# Patient Record
Sex: Female | Born: 1945 | Hispanic: Yes | Marital: Married | Smoking: Never smoker
Health system: Southern US, Community
[De-identification: ages and names within clinical notes are randomized; demographics above are authoritative.]

## PROBLEM LIST (undated history)

## (undated) DIAGNOSIS — M1990 Unspecified osteoarthritis, unspecified site: Secondary | ICD-10-CM

## (undated) DIAGNOSIS — E785 Hyperlipidemia, unspecified: Secondary | ICD-10-CM

## (undated) DIAGNOSIS — M810 Age-related osteoporosis without current pathological fracture: Secondary | ICD-10-CM

## (undated) HISTORY — PX: COLONOSCOPY: SHX174

## (undated) HISTORY — PX: DILATION AND CURETTAGE OF UTERUS: SHX78

## (undated) HISTORY — DX: Hyperlipidemia, unspecified: E78.5

## (undated) HISTORY — DX: Age-related osteoporosis without current pathological fracture: M81.0

## (undated) HISTORY — PX: OOPHORECTOMY: SHX86

## (undated) HISTORY — DX: Unspecified osteoarthritis, unspecified site: M19.90

---

## 2001-06-04 HISTORY — PX: CYSTECTOMY: SUR359

## 2012-01-28 ENCOUNTER — Encounter: Payer: Self-pay | Admitting: Obstetrics & Gynecology

## 2012-02-18 ENCOUNTER — Encounter: Payer: Self-pay | Admitting: Internal Medicine

## 2012-03-14 ENCOUNTER — Ambulatory Visit: Payer: Self-pay | Admitting: Internal Medicine

## 2012-03-20 ENCOUNTER — Ambulatory Visit (INDEPENDENT_AMBULATORY_CARE_PROVIDER_SITE_OTHER): Payer: Self-pay | Admitting: Obstetrics & Gynecology

## 2012-03-20 ENCOUNTER — Encounter: Payer: Self-pay | Admitting: Obstetrics & Gynecology

## 2012-03-20 VITALS — BP 139/90 | HR 73 | Temp 98.1°F | Ht <= 58 in | Wt 164.1 lb

## 2012-03-20 DIAGNOSIS — R32 Unspecified urinary incontinence: Secondary | ICD-10-CM

## 2012-03-20 NOTE — Progress Notes (Signed)
Subjective:     Patient ID: Andrea Hendricks, female   DOB: 08-24-1945, 66 y.o.   MRN: [id number]  HPI G12 P10  Pt was seen for a PAP screen and was told that she had a 'prolapse'.  Pt denies sx of prolapse.   Deies 'bulge'  Pt does report leakage of urine if she drinks a lot of water.  This is bothersome.   No visual acuity check for >10years.  Review of Systems     Objective:   Physical ExamBP 139/90  Pulse 73  Temp 98.1 F (36.7 C) (Oral)  Ht 4' 9.5" (1.461 m)  Wt 164 lb 1.6 oz (74.435 kg)  BMI 34.90 kg/m2  HEENT: Pt squints when speaking.  Could not read numbers on BP cuff without corretion  GU: EGBUS: no lesions Vagina: no blood in vault Cervix: no lesion; no mucopurulent d/c Uterus: small, mobile; no prolapse noted Adnexa: no masses; non tender        Assessment:    Incontinence- suspect stress- pt has previously been told to do "Kegel' exercises but, has never been told HOW to do them or WHAT they were- reviewed Kegel exercises.  Rec Kegel's 10 4x/day       Plan:     F/u 3 months Kegel's F/u with primary care physician Get vision checked  Andrea Hendricks, M.D., [facility]

## 2012-03-20 NOTE — Patient Instructions (Signed)
Incontinencia urinaria (Urinary Incontinence) El mdico desea que usted tenga esta informacin sobre la incontinencia urinaria. La incontinencia urinaria es la imposibilidad de retener la orina hasta que decida ir al bao. CAUSAS El agrandamiento de la prstata es una causa frecuente de incontinencia urinaria. Pero hay muchas causas diferentes para perder el control urinario. Ellas son:  Medicamentos  Infecciones  Problemas prostticos  Una ciruga  Enfermedades neurolgicas  Factores emocionales DIAGNSTICO La evaluacin de la causa de incontinencia es importante para la eleccin del mejor tratamiento. Podrn indicarle:  Sherlyn Lees.  Radiografas de vejiga y riones.  Una cistoscopa Consiste en un examen de la vejiga utilizando un dispositivo similar a un telescopio pequeo. TRATAMIENTO Para los pacientes que sufren incontinencia, la higiene diaria y la utilizacin de apsitos o paales descartables para adultos evitar olores desagradables y lesiones en la piel debido a la humedad. El cambio de medicamentos puede ayudar a Geneticist, molecular. El mdico podr prescribir algunos medicamentos para volver a Museum/gallery conservator. Evite la cafena. Puede sobreestimular la vejiga. Use el bao con regularidad. Trate de ir cada 2  3 horas, aunque no sienta la necesidad. Tmese el tiempo para vaciar la vejiga completamente. Despus de orinar, espere un minuto. Luego trate de orinar nuevamente. Tambin podra ser necesario utilizar dispositivos externos para Landscape architect la orina tales como un catter Production assistant, radio (Armed forces operational officer). Algunos problemas prostticos requieren [place] para su correccin. Comunquese con su mdico para obtener ms informacin. Document Released: 05/21/2005 Document Revised: 08/13/2011 [facility] Patient Information 2013 [place], [place].

## 2012-03-21 ENCOUNTER — Encounter: Payer: Self-pay | Admitting: Internal Medicine

## 2012-03-21 ENCOUNTER — Other Ambulatory Visit (INDEPENDENT_AMBULATORY_CARE_PROVIDER_SITE_OTHER): Payer: Self-pay | Admitting: General Practice

## 2012-03-21 ENCOUNTER — Ambulatory Visit (INDEPENDENT_AMBULATORY_CARE_PROVIDER_SITE_OTHER): Payer: Self-pay | Admitting: Internal Medicine

## 2012-03-21 VITALS — BP 134/78 | HR 88 | Ht 59.0 in | Wt 167.0 lb

## 2012-03-21 VITALS — BP 131/86 | HR 64 | Temp 97.1°F | Ht <= 58 in | Wt 163.2 lb

## 2012-03-21 DIAGNOSIS — IMO0001 Reserved for inherently not codable concepts without codable children: Secondary | ICD-10-CM

## 2012-03-21 DIAGNOSIS — M810 Age-related osteoporosis without current pathological fracture: Secondary | ICD-10-CM

## 2012-03-21 DIAGNOSIS — R1032 Left lower quadrant pain: Secondary | ICD-10-CM

## 2012-03-21 DIAGNOSIS — Z23 Encounter for immunization: Secondary | ICD-10-CM

## 2012-03-21 DIAGNOSIS — M7918 Myalgia, other site: Secondary | ICD-10-CM

## 2012-03-21 MED ORDER — INFLUENZA VIRUS VACC SPLIT PF IM SUSP
0.5000 mL | Freq: Once | INTRAMUSCULAR | Status: AC
  Administered 2012-03-21: 0.5 mL via INTRAMUSCULAR

## 2012-03-21 NOTE — Patient Instructions (Addendum)
Obtain a PCP as we discussed.  And return to see [place] as needed.  Thank you for choosing me and [facility].  Iva Boop, M.D., [facility]

## 2012-03-21 NOTE — Progress Notes (Signed)
Subjective:    Patient ID: Andrea Hendricks, female    DOB: [birth year], 66 y.o.   MRN: [id number]  HPI This pleasant middle-aged [place] woman is here with her daughter, who served as Equities trader. Her daughter performs those services for [employer]. The patient lives in [place] but is here for other opinions. She has a history of osteoporosis and has been treating. She was on therapy with weekly bisphosphonate that that caused a lot of bone pain so she stopped it. Her complaint to me is that of left lower quadrant pain that comes and goes. It may be there for hours at a time. There is no clear trigger. It is exacerbated by walking and there is associated pain in the left buttock and maybe some radiation to the thigh. Does not disturb her sleep. It is not associated a bowel habit changes and she's had no rectal bleeding or melena or hematochezia. She denies upper GI symptoms as well. She has some midthoracic back pain when she coughs. She has some occasional left back pain in the lower area though more so in the buttock. The pain is dull and persistent in very bothersome when it does occur. It makes it very difficult for her to ambulate and she says she walks funny.  She had a CT of the abdomen and pelvis because of the chronic left lower quadrant pain, the pain has been an intermittent problem for a year, this report is reviewed showing fatty liver, mild sigmoid diverticulosis and a benign simple cyst in the left kidney. He is status post left to offer ectomy.  She reports a negative colonoscopy 2-3 years ago. Medications, allergies, past medical history, past surgical history, family history and social history are reviewed and updated in the EMR.  Review of Systems Positive for stress urinary incontinence. She breaks out in a rash when she cooks with grease or oil. All other review of systems negative.    Objective:   Physical Exam General:  Well-developed, well-nourished and in no acute  distress Eyes:  anicteric. ENT:   Mouth and posterior pharynx free of lesions. She is missing most of her teeth. Neck:   supple w/o thyromegaly or mass.  Lungs: Clear to auscultation bilaterally. Heart:  S1S2, no rubs, murmurs, gallops. Abdomen:  soft, non-tender, no hepatosplenomegaly, hernia, or mass and BS+.  Rectal: E-mail staff present. Normal anoderm. Normal resting tone. Tiny rectocele, small amount of brown formed stool which is heme negative. No mass. Lymph:  no cervical or supraclavicular adenopathy. Extremities:   no edema there is no straight a gray sign. There is no pain in the abdomen back or hips with manipulation of the hips. Deep tendon reflexes are normal in the knees and ankles and there is no clonus and there is negative Babinski. Skin   no rash. Neuro:  A&O x 3.  Psych:  appropriate mood and  Affect.   Data Reviewed:  As per history of present illness      Assessment & Plan:   1. LLQ pain   2. Osteoporosis   3. Left buttock pain    1. I think she has referred pain, probably has some lumbar radiculopathy issue. I do not think she has a gastrointestinal cause of her pain. 2. No further GI evaluation 3. Follow with primary care, they intend to obtain a new primary care provider. She needs followup of the osteoporosis and either physical therapy or caps nonsteroidal treatment for her back pain. Would avoid steroids given the  osteoporosis. She takes long-term nonsteroidals she should use a PPI daily when on those. 4. Of explained this to the daughter and the patient, all questions were answered.

## 2013-11-19 ENCOUNTER — Ambulatory Visit (INDEPENDENT_AMBULATORY_CARE_PROVIDER_SITE_OTHER): Payer: Medicare Other | Admitting: Obstetrics & Gynecology

## 2013-11-19 ENCOUNTER — Encounter: Payer: Self-pay | Admitting: Obstetrics & Gynecology

## 2013-11-19 ENCOUNTER — Other Ambulatory Visit: Payer: Self-pay | Admitting: Obstetrics & Gynecology

## 2013-11-19 VITALS — BP 114/76 | HR 71 | Wt 144.2 lb

## 2013-11-19 DIAGNOSIS — R928 Other abnormal and inconclusive findings on diagnostic imaging of breast: Secondary | ICD-10-CM

## 2013-11-19 DIAGNOSIS — N63 Unspecified lump in unspecified breast: Secondary | ICD-10-CM

## 2013-11-19 NOTE — Progress Notes (Signed)
[facility] called and consult appointment made with Dr. Jean RosenthalJackson (per patient request) for 0930 11/30/13 ; patient advised to arrive at 0915, needs to bring images.

## 2013-11-19 NOTE — Progress Notes (Signed)
Would like second opinion regarding breast lump. Also has pelvic pain.

## 2013-11-20 ENCOUNTER — Encounter: Payer: Self-pay | Admitting: Obstetrics & Gynecology

## 2013-11-20 NOTE — Progress Notes (Signed)
Subjective:     Patient ID: Andrea Hendricks, female   DOB: 06/12/1945, 68 y.o.   MRN: [id number]  HPI [place] in [place] screening mammogram and also had a diagnostic mammogram and sono  The findings revealed a suspicious lesion.  Per the pts daughter she was not called and the daughter only discovered the findings by calling the office and insisting on getting the results.  She is here for a second.    Review of Systems     Objective:   Physical ExamPt in NAD Breast exam : bilaterally without masses, nipple discharge or skin changes.  See media tab for results    Assessment:     Mass on recent screening and diagnostic mammogram     Plan:     Referral to breast surgery for full Pt daughter instructed to take all the results of her studies with her to the visit Diagnostic mammogram and us ordered but the need for them ot be evaluated by the breast surgeon.

## 2013-11-30 ENCOUNTER — Ambulatory Visit
Admission: RE | Admit: 2013-11-30 | Discharge: 2013-11-30 | Disposition: A | Discharge status: Home / Self Care | Payer: Medicare Other | Source: Ambulatory Visit | Attending: Obstetrics & Gynecology | Admitting: Obstetrics & Gynecology

## 2013-11-30 DIAGNOSIS — N63 Unspecified lump in unspecified breast: Secondary | ICD-10-CM

## 2013-12-02 ENCOUNTER — Other Ambulatory Visit: Payer: Self-pay | Admitting: Obstetrics & Gynecology

## 2013-12-02 DIAGNOSIS — R102 Pelvic and perineal pain: Secondary | ICD-10-CM

## 2013-12-11 ENCOUNTER — Ambulatory Visit (HOSPITAL_COMMUNITY)
Admission: RE | Admit: 2013-12-11 | Discharge: 2013-12-11 | Disposition: A | Discharge status: Home / Self Care | Payer: Medicare Other | Source: Ambulatory Visit | Attending: Obstetrics & Gynecology | Admitting: Obstetrics & Gynecology

## 2013-12-11 DIAGNOSIS — R102 Pelvic and perineal pain: Secondary | ICD-10-CM

## 2013-12-11 DIAGNOSIS — N949 Unspecified condition associated with female genital organs and menstrual cycle: Secondary | ICD-10-CM | POA: Insufficient documentation

## 2013-12-11 DIAGNOSIS — N838 Other noninflammatory disorders of ovary, fallopian tube and broad ligament: Secondary | ICD-10-CM | POA: Insufficient documentation

## 2013-12-11 DIAGNOSIS — R1032 Left lower quadrant pain: Secondary | ICD-10-CM | POA: Insufficient documentation

## 2013-12-11 DIAGNOSIS — Z780 Asymptomatic menopausal state: Secondary | ICD-10-CM | POA: Insufficient documentation

## 2013-12-16 ENCOUNTER — Encounter: Payer: Self-pay | Admitting: *Deleted

## 2013-12-16 ENCOUNTER — Telehealth: Payer: Self-pay | Admitting: General Practice

## 2013-12-16 DIAGNOSIS — N83209 Unspecified ovarian cyst, unspecified side: Secondary | ICD-10-CM

## 2013-12-16 NOTE — Telephone Encounter (Signed)
Message copied by Kathee DeltonHILLMAN, Nasiyah Laverdiere L on Wed Dec 16, 2013  2:33 PM ------      Message from: Willodean RosenthalHARRAWAY-SMITH, CAROLYN      Created: Wed Dec 16, 2013 11:36 AM       Please call pt with interpreter.  There is a SMALL cyst on the ovary.  This appears to be chronic.  Unlikely the cause of her pain.            Needs repeat sono in 3 months.            Please schedule.            Thx,      clh-S ------

## 2013-12-16 NOTE — Telephone Encounter (Signed)
Made ultrasound appt 10/9 @ 12:45.[facility] and [facility]'s daughter answered. Informed her of ultrasound and need for follow up in 3 months and gave her ultrasound appt of 10/9 @ 12:45. She verbalizes understanding and is aware of appt tomorrow. She had no other questions

## 2013-12-17 ENCOUNTER — Ambulatory Visit (INDEPENDENT_AMBULATORY_CARE_PROVIDER_SITE_OTHER): Payer: Medicare Other | Admitting: Obstetrics & Gynecology

## 2013-12-17 ENCOUNTER — Encounter: Payer: Self-pay | Admitting: Obstetrics & Gynecology

## 2013-12-17 VITALS — BP 130/85 | HR 66 | Temp 97.4°F | Wt 138.7 lb

## 2013-12-17 DIAGNOSIS — M25559 Pain in unspecified hip: Secondary | ICD-10-CM

## 2013-12-17 DIAGNOSIS — M25552 Pain in left hip: Secondary | ICD-10-CM

## 2013-12-17 DIAGNOSIS — R52 Pain, unspecified: Secondary | ICD-10-CM

## 2013-12-17 NOTE — Progress Notes (Signed)
Subjective:     Patient ID: Andrea Hendricks, female   DOB: 1946-01-21, 68 y.o.   MRN: [id number]  HPI Pt c/o pain on LEFT side.  She was worried that it was her ovary.  The sono reveals a small simple cyst on her RIGHT side. She was previously diagnosed with arthritis but, not of the hip specifically.  She takes no meds for this.     Review of Systems     Objective:   Physical Exam BP 130/85  Pulse 66  Temp(Src) 97.4 F (36.3 C) (Oral)  Wt 138 lb 11.2 oz (62.914 kg) Pt in NAD Exam deferred  12/11/2013 CLINICAL DATA: Pelvic pain. Left lower quadrant pain. Status post  menopause.  EXAM:  TRANSABDOMINAL ULTRASOUND OF PELVIS  TECHNIQUE:  Transabdominal ultrasound examination of the pelvis was performed  including evaluation of the uterus, ovaries, adnexal regions, and  pelvic cul-de-sac.  COMPARISON: None.  FINDINGS:  Uterus  Measurements: 6.2 x 2.9 x 4.1 cm. No fibroids or other mass  visualized. Peripheral calcifications are within normal limits.  Endometrium  Thickness: 3 mm, within normal limits. No focal abnormality  visualized.  Right ovary  Measurements: 2.3 x 1.9 x 2.6 cm, within normal limits 2 septated  cysts are evident. One measures 1.3 x 1.3 x 1.4 cm. The larger  measures 1.7 x 1.4 x 1.2 cm. Normal appearance/no adnexal mass.  Left ovary  Measurements: Not visualized.  Other findings: No free fluid  IMPRESSION:  1. Normal appearance of uterus for age.  2. 2 septated lesions in the right adnexa but measure less than 2 cm  without significant color Doppler flow. These are likely chronic.  3. The left ovary is not visualized.  4. No acute or focal lesion to explain the patient's left lower  quadrant symptoms.       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

## 2013-12-17 NOTE — Patient Instructions (Signed)
Artritis inespecífica °(Arthritis, Nonspecific) °La artritis es la inflamación de una articulación. Los síntomas son dolor, enrojecimiento, calor o hinchazón. Pueden verse involucradas una o más articulaciones. Hay diferentes tipos de artritis. El médico no podrá diagnosticar inmediatamente cuál es el tipo de artritis que usted sufre.  °CAUSAS °La causa más frecuente es el desgaste de la articulación (osteoartritis). Esto ocasiona lesiones en el cartílago, que puede romperse con el tiempo. Las zonas más afectadas por este tipo de artritis son las rodillas, caderas, espalda y cuello. °Otros tipos de artritis y causas frecuentes de dolor en la articulación son: °· Esguinces y otras lesiones cercanas a la articulación}. En algunos casos, esguinces y lesiones menores causan dolor e hinchazón que aparece horas más tarde. °· Artritis reumatoidea Afecta las manos, pies y rodillas. Generalmente afecta ambos lados del cuerpo al mismo tiempo. Generalmente se asocia a enfermedades crónicas, fiebre, pérdida de peso y debilidad general. °· Artritis por cristales. La gota y la pseudogota pueden causar dolor intenso agudo ocasional, enrojecimiento e hinchazón del pie, el tobillo o la rodilla. °· Artritis infecciosa. Las bacterias pueden penetrar en la articulación a través de una herida en la piel. Esto puede causar una infección en la articulación. Las bacterias y virus también pueden diseminarse a través del torrente sanguíneo y afectar las articulaciones. °· Reacciones a medicamentos, infecciosas y alérgicas. En algunos casos las articulaciones duelen levemente y están ligeramente hinchadas en este tipo de enfermedad. °SÍNTOMAS °· El dolor es el síntoma principal. °· La articulación también pueden verse roja, hinchada y caliente al tacto. °· En ciertos tipos de artritis hay fiebre o malestar general. °· En la articulación que presenta artritis sentirá dolor con el movimiento. En otros tipos de artritis hay  rigidez. °DIAGNÓSTICO: °El médico sospechará artritis basándose en la descripción de los síntomas y en el examen. Será necesario realizar pruebas para diagnosticar el tipo de artritis. °· Análisis de sangre y en algunos casos de orina. °· Radiografías y en algunos casos tomografía computada o diagnóstico por imágenes. °· La remoción del líquido de la articulación (artrocentesis) se realiza para controlar la presencia de bacterias, cristales o por otras causas. Su médico (o un especialista) adormecerán la zona de la articulación con un anestésico local y utilizarán una aguja para retirar líquido de la articulación para ser examinado. Este procedimiento es sólo mínimamente molesto. °· Aún con estas pruebas, el médico no podrá decir qué tipo de artritis usted sufre. La consulta con un especialista (reumatólogo) puede ser de utilidad. °TRATAMIENTO °El médico comentará con usted el tratamiento específico para su tipo de artritis. Si el tipo específico no puede determinarse, podrán aplicarse las siguientes recomendaciones generales.  °El tratamiento para el dolor intenso de las articulaciones consiste en: °· Hacer reposo °· Elevar el miembro. °· Podrán prescribirle medicamentos antiinflamatorios (como ibuprofeno). Evite las actividades que aumenten el dolor. °· Sólo tome medicamentos de venta libre o prescriptos para calmar el dolor y las molestias, según las indicaciones de su médico. °· Puede aplicarse compresas frías sobre la articulación dolorida durante 10 a 15 minutos cada hora. Las compresas calientes también pueden ser beneficiosas, pero no las utilice durante la noche. No use compresas calientes sin autorización de su médico, si es diabético. °· Una inyección de corticoides en la articulación artrítica puede ayudar a reducir el dolor y la hinchazón. °· Si una artritis aguda empeora en los siguientes 1 ó 2 días, será necesario descartar una infección. °El tratamiento prolongado implica la modificación de  actividades y del estilo   de vida para reducir el estrés en la articulación. Puede ser necesario que baje de peso. La actividad física es necesaria para nutrir el cartílago de la articulación y eliminar los desechos. Esto ayuda a mantener fuertes los músculos que rodean la articulación. °INSTRUCCIONES PARA EL CUIDADO DOMICILIARIO °· No tome aspirina para aliviar el dolor si se sospecha que sufre gota. Esto eleva los niveles de ácido úrico. °· Solo tome medicamentos que se pueden comprar sin receta o recetados para el dolor, malestar o fiebre, como le indica el médico. °· Haga reposo todo el tiempo que pueda. °· Si la articulación está hinchada, manténgala elevada. °· Utilice muletas si la articulación que le duele está en la pierna. °· Beber abundante cantidad de líquidos será beneficioso para ciertos tipos de artritis. °· Siga las indicaciones del profesional. °· La actividad física regular puede ser beneficiosa, incluyendo las actividades de bajo impacto como: °¨ Natación. °¨ Aquagym. °¨ Andar en bicicleta. °¨ Caminar. °· La rigidez matutina se alivia con una ducha caliente. °· También es beneficioso que realice ejercicios de amplitud de movimiento. °SOLICITE ATENCIÓN MÉDICA SI: °· No se siente mejor o empeora luego de las 24 horas. °· Presenta efectos adversos por los medicamentos y no mejora con el tratamiento. °SOLICITE ATENCIÓN MÉDICA INMEDIATAMENTE SI: °· Tiene fiebre. °· Presenta fiebre o dolor intenso, hinchazón o enrojecimiento. °· Muchas articulaciones están involucradas y están hinchadas y siente dolor. °· Tiene un dolor intenso en la espalda o siente debilidad en las piernas. °· Pierde el control de la vejiga o del intestino. °Document Released: 05/21/2005 Document Revised: 08/13/2011 °Exit[employer]® Patient Information ©2015 [employer]. This information is not intended to replace advice given to you by your health care provider. Make sure you discuss any questions you have with your health care  provider. ° °

## 2014-03-12 ENCOUNTER — Ambulatory Visit (HOSPITAL_COMMUNITY)
Admission: RE | Admit: 2014-03-12 | Discharge: 2014-03-12 | Disposition: A | Discharge status: Home / Self Care | Payer: Medicare Other | Source: Ambulatory Visit | Attending: Obstetrics & Gynecology | Admitting: Obstetrics & Gynecology

## 2014-03-12 DIAGNOSIS — N83209 Unspecified ovarian cyst, unspecified side: Secondary | ICD-10-CM

## 2014-03-12 DIAGNOSIS — N8320 Unspecified ovarian cysts: Secondary | ICD-10-CM | POA: Diagnosis not present

## 2014-03-12 DIAGNOSIS — R1032 Left lower quadrant pain: Secondary | ICD-10-CM | POA: Diagnosis not present

## 2014-03-16 ENCOUNTER — Telehealth: Payer: Self-pay | Admitting: *Deleted

## 2014-03-16 NOTE — Telephone Encounter (Signed)
Message copied by Dorothyann PengHAIZLIP, Shadeed Colberg E on Tue Mar 16, 2014  4:09 PM ------      Message from: Willodean RosenthalHARRAWAY-SMITH, CAROLYN      Created: Tue Mar 16, 2014 10:16 AM       Please call pt (need interpreter).  She ovarian cyst on the RIGHT is unchanged.  Rec f/u sono in 1 YEAR. F/u sooner prn            Thx,      clh-S  ------

## 2014-03-16 NOTE — Telephone Encounter (Signed)
Attempted to contact patient with interpreter, no answer, left message for patient to call clinic.

## 2014-03-18 NOTE — Telephone Encounter (Signed)
Contacted patient, spoke with daughter, results given.  No further questions.

## 2014-04-05 ENCOUNTER — Encounter: Payer: Self-pay | Admitting: Obstetrics & Gynecology

## 2014-05-11 ENCOUNTER — Other Ambulatory Visit: Payer: Self-pay | Admitting: Obstetrics & Gynecology

## 2014-05-11 DIAGNOSIS — N6310 Unspecified lump in the right breast, unspecified quadrant: Secondary | ICD-10-CM

## 2014-05-21 ENCOUNTER — Other Ambulatory Visit: Payer: Medicare Other

## 2014-06-30 ENCOUNTER — Ambulatory Visit
Admission: RE | Admit: 2014-06-30 | Discharge: 2014-06-30 | Disposition: A | Discharge status: Home / Self Care | Payer: Medicare Other | Source: Ambulatory Visit | Attending: Obstetrics & Gynecology | Admitting: Obstetrics & Gynecology

## 2014-06-30 DIAGNOSIS — N6310 Unspecified lump in the right breast, unspecified quadrant: Secondary | ICD-10-CM

## 2014-07-01 ENCOUNTER — Telehealth: Payer: Self-pay | Admitting: *Deleted

## 2014-07-01 DIAGNOSIS — N63 Unspecified lump in unspecified breast: Secondary | ICD-10-CM

## 2014-07-01 NOTE — Telephone Encounter (Signed)
-----   Message from Willodean Rosenthalarolyn Harraway-Smith, MD sent at 06/30/2014 10:21 AM EST ----- Please call pt.  (needs Spanish interpreter) she needs a f/u mammogram with sono in 6 months.  Please make sure that this is scheduled.  Thx, clh-S

## 2014-07-01 NOTE — Telephone Encounter (Signed)
Called and spoke with patients daughter. Appointment is not yet set up for patient as far as she knows. Asked that appointment be made on any day but Monday and in the morning as early as possible.  Called breast center and scheduled appointment for December 29 898. Patients daughter stated that she would be in our clinic on Monday and will pick up appt info then.

## 2014-12-30 ENCOUNTER — Ambulatory Visit
Admission: RE | Admit: 2014-12-30 | Discharge: 2014-12-30 | Disposition: A | Discharge status: Home / Self Care | Payer: Medicare Other | Source: Ambulatory Visit | Attending: Obstetrics & Gynecology | Admitting: Obstetrics & Gynecology

## 2014-12-30 ENCOUNTER — Other Ambulatory Visit: Payer: Self-pay | Admitting: Obstetrics & Gynecology

## 2014-12-30 DIAGNOSIS — N63 Unspecified lump in unspecified breast: Secondary | ICD-10-CM

## 2015-05-22 IMAGING — US US TRANSVAGINAL NON-OB
1 series · 14 of 25 positions shown · non-contrast
Comparison: None.

CLINICAL DATA: Pelvic pain. Left lower quadrant pain. Status post
menopause.

EXAM:
TRANSABDOMINAL ULTRASOUND OF PELVIS
TECHNIQUE: Transabdominal ultrasound examination of the pelvis was performed
including evaluation of the uterus, ovaries, adnexal regions, and
pelvic cul-de-sac.

[Series 1: us pelvis complete · 70 acquisitions, 14 frames shown]
[im 1/70]
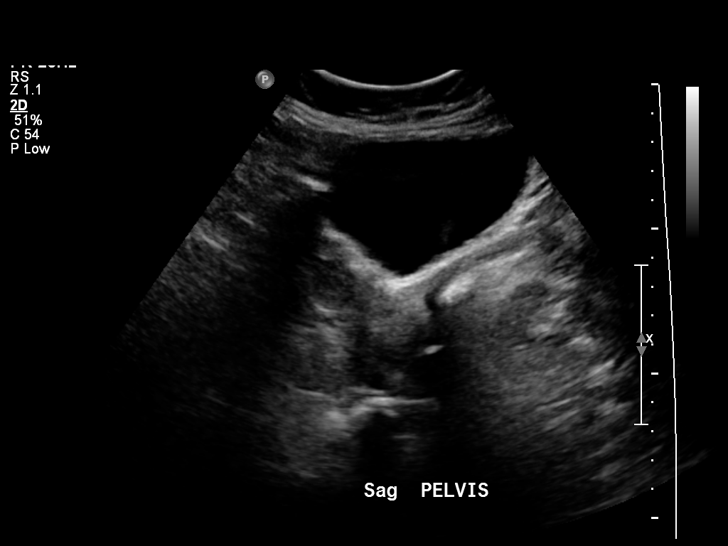
[im 6/70]
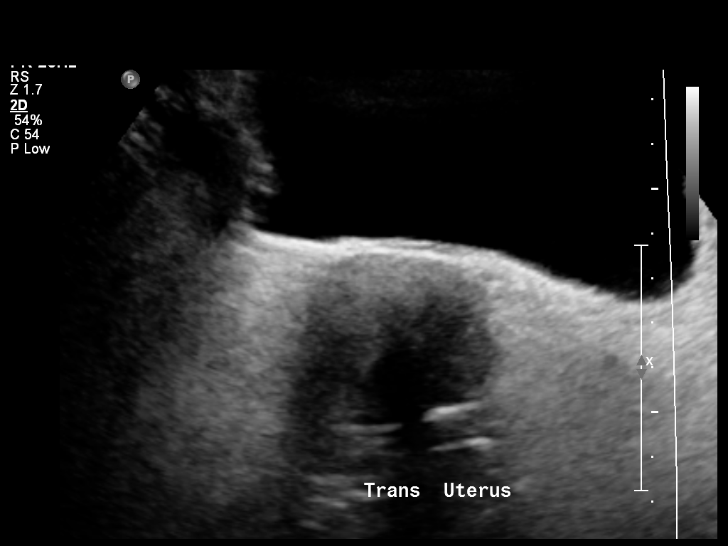
[im 12/70]
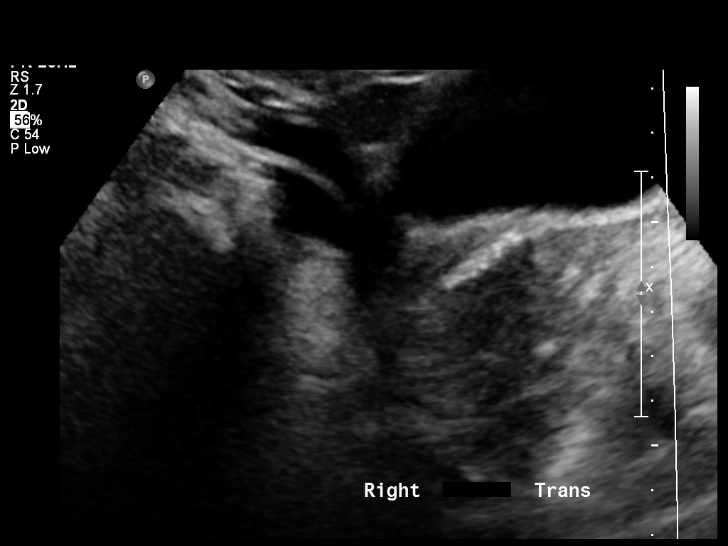
[im 18/70]
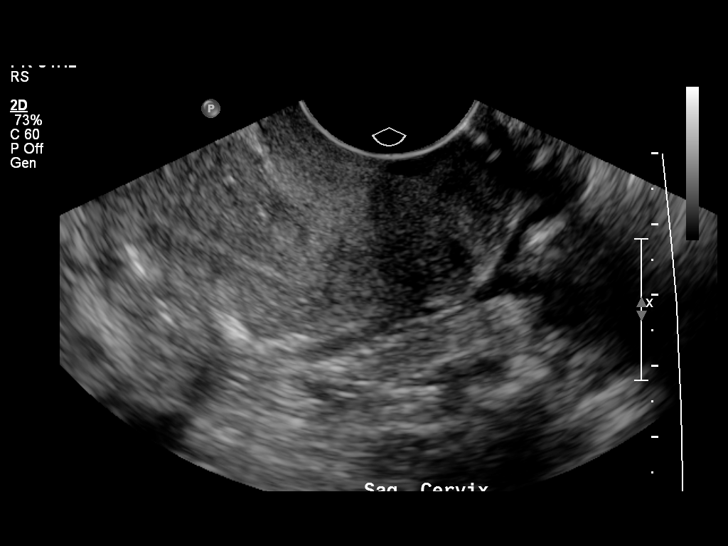
[im 24/70]
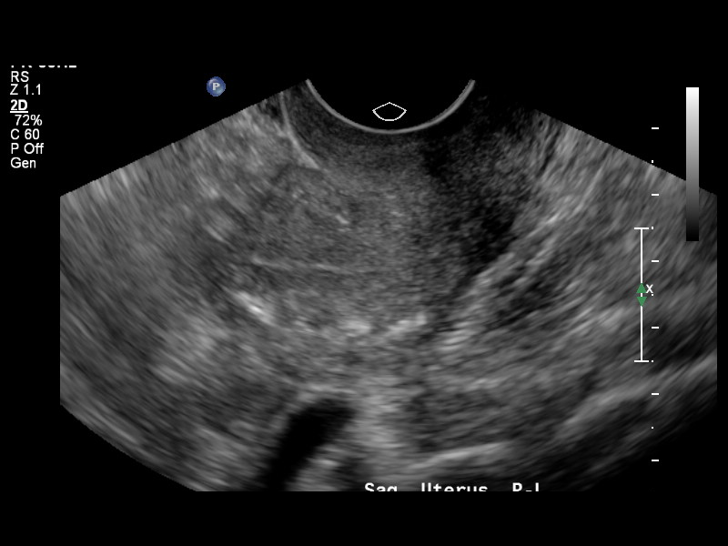
[im 26/70]
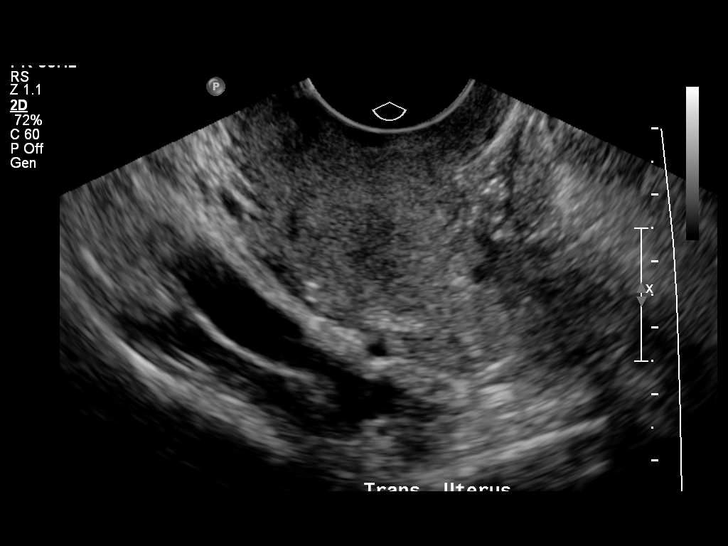
[im 32/70]
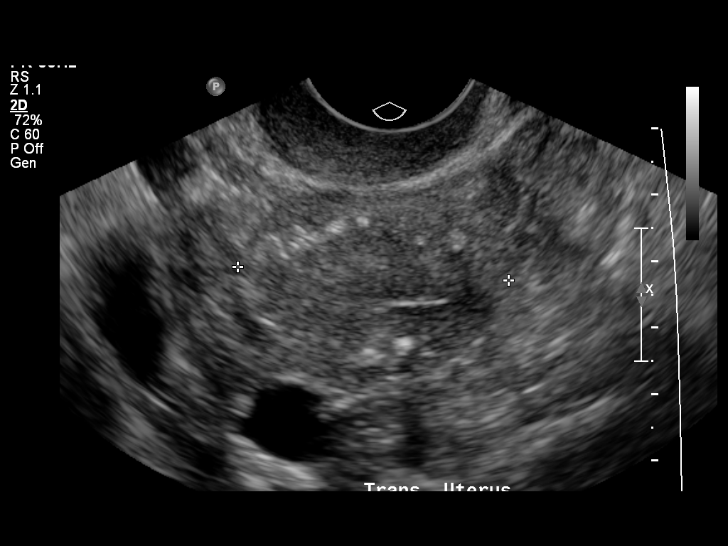
[im 38/70]
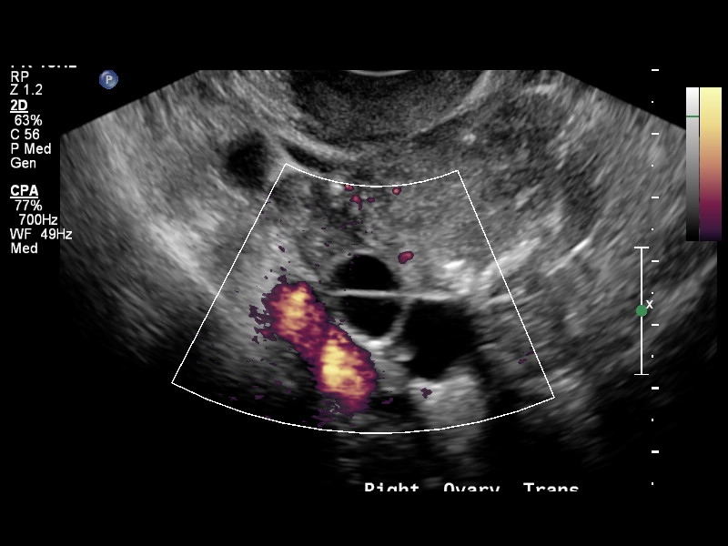
[im 44/70]
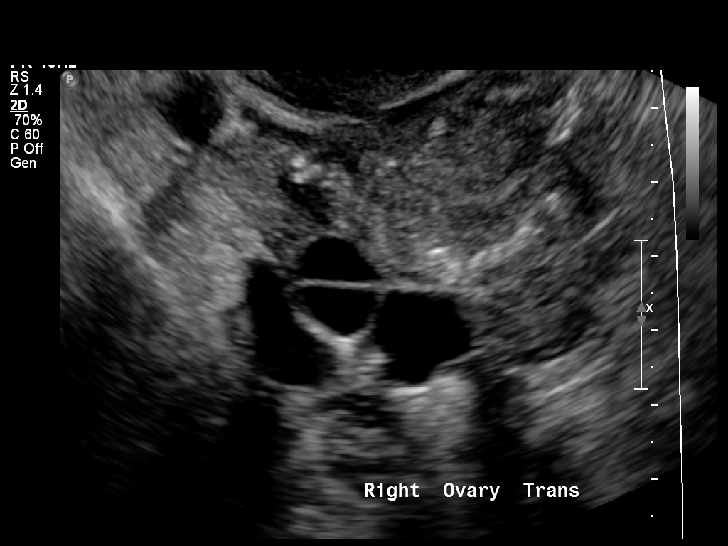
[im 47/70]
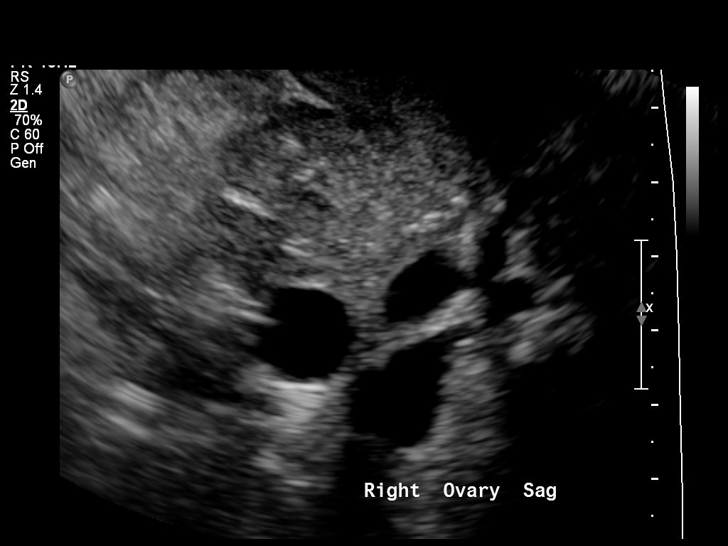
[im 52/70]
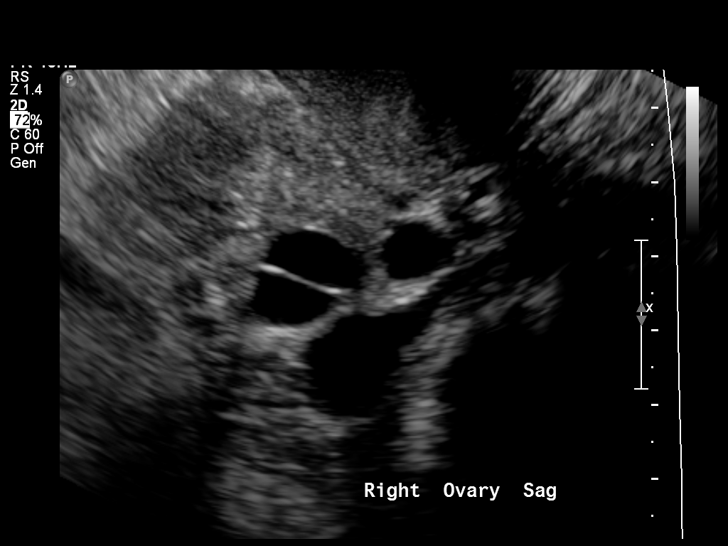
[im 58/70]
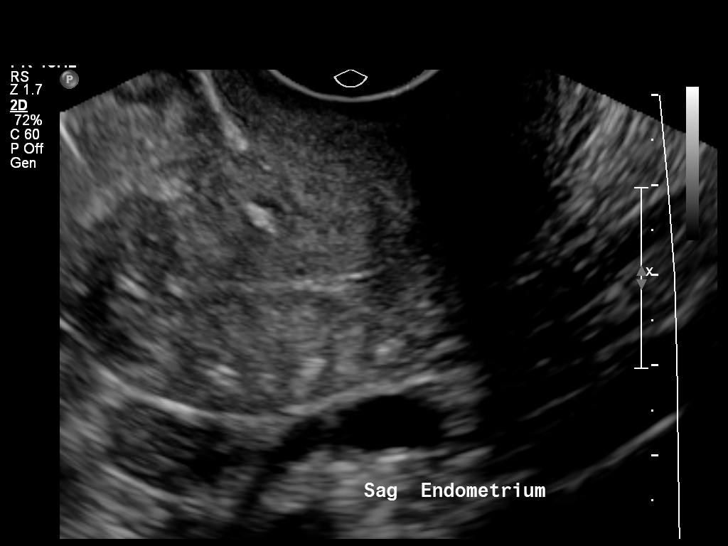
[im 64/70]
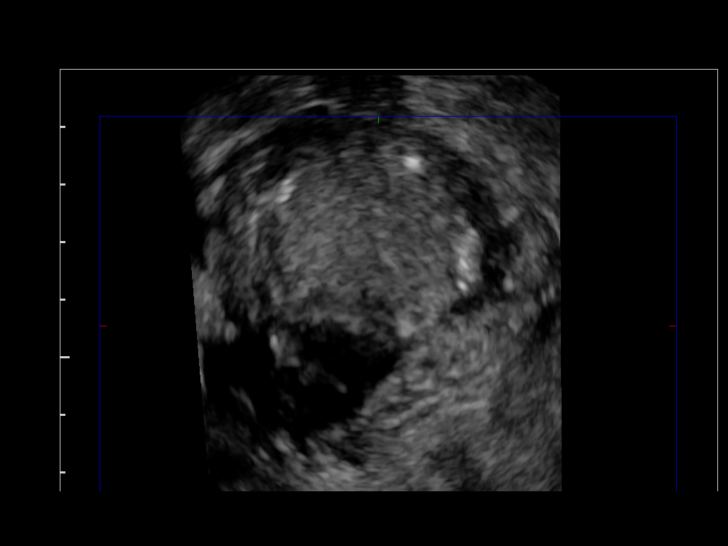
[im 70/70]
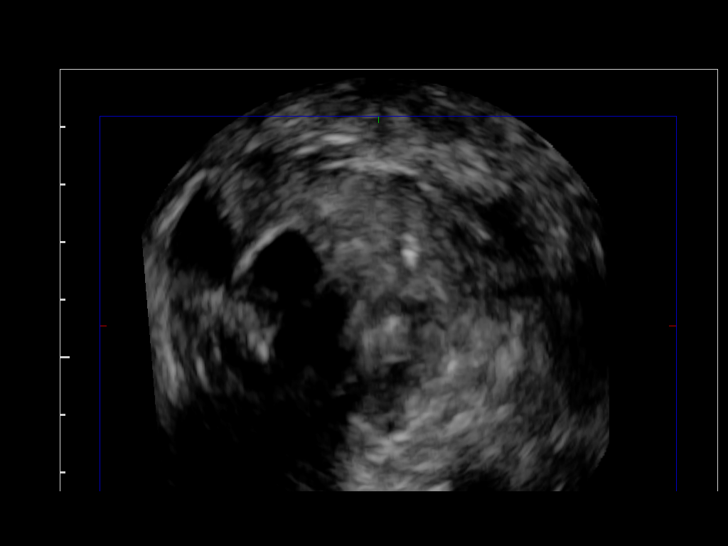

[14 of 25 positions shown; findings below may reference images not displayed]

FINDINGS: Uterus

Measurements: 6.2 x 2.9 x 4.1 cm.. No fibroids or other mass
visualized. Peripheral calcifications are within normal limits.

Endometrium

Thickness: 3 mm, within normal limits. No focal abnormality
visualized.

Right ovary

Measurements: 2.3 x 1.9 x 2.6 cm, within normal limits 2 septated
cysts are evident. One measures 1.3 x 1.3 x 1.4 cm. The larger
measures 1.7 x 1.4 x 1.2 cm.. Normal appearance/no adnexal mass.

Left ovary

Measurements:  Not visualized.

Other findings:  No free fluid
IMPRESSION: 1. Normal appearance of uterus for age.
2. 2 septated lesions in the right adnexa but measure less than 2 cm
without significant color Doppler flow. These are likely chronic.
3. The left ovary is not visualized.
4. No acute or focal lesion to explain the patient's left lower
quadrant symptoms.
